# Patient Record
Sex: Male | Born: 1990 | Race: Black or African American | Hispanic: No | Marital: Single | Smoking: Never smoker
Health system: Southern US, Community
[De-identification: ages and names within clinical notes are randomized; demographics above are authoritative.]

---

## 2014-11-29 ENCOUNTER — Emergency Department (HOSPITAL_COMMUNITY): Payer: Self-pay

## 2014-11-29 ENCOUNTER — Encounter (HOSPITAL_COMMUNITY): Payer: Self-pay | Admitting: Emergency Medicine

## 2014-11-29 ENCOUNTER — Emergency Department (HOSPITAL_COMMUNITY)
Admission: EM | Admit: 2014-11-29 | Discharge: 2014-11-29 | Disposition: A | Discharge status: Home / Self Care | Payer: Self-pay | Attending: Emergency Medicine | Admitting: Emergency Medicine

## 2014-11-29 DIAGNOSIS — Y92310 Basketball court as the place of occurrence of the external cause: Secondary | ICD-10-CM | POA: Insufficient documentation

## 2014-11-29 DIAGNOSIS — Y998 Other external cause status: Secondary | ICD-10-CM | POA: Insufficient documentation

## 2014-11-29 DIAGNOSIS — S90121A Contusion of right lesser toe(s) without damage to nail, initial encounter: Secondary | ICD-10-CM

## 2014-11-29 DIAGNOSIS — W2105XA Struck by basketball, initial encounter: Secondary | ICD-10-CM | POA: Insufficient documentation

## 2014-11-29 DIAGNOSIS — S90111A Contusion of right great toe without damage to nail, initial encounter: Secondary | ICD-10-CM | POA: Insufficient documentation

## 2014-11-29 DIAGNOSIS — Y9367 Activity, basketball: Secondary | ICD-10-CM | POA: Insufficient documentation

## 2014-11-29 NOTE — Discharge Instructions (Signed)
Contusion °A contusion is a deep bruise. Contusions are the result of an injury that caused bleeding under the skin. The contusion may turn blue, purple, or yellow. Minor injuries will give you a painless contusion, but more severe contusions may stay painful and swollen for a few weeks.  °CAUSES  °A contusion is usually caused by a blow, trauma, or direct force to an area of the body. °SYMPTOMS  °· Swelling and redness of the injured area. °· Bruising of the injured area. °· Tenderness and soreness of the injured area. °· Pain. °DIAGNOSIS  °The diagnosis can be made by taking a history and physical exam. An X-ray, CT scan, or MRI may be needed to determine if there were any associated injuries, such as fractures. °TREATMENT  °Specific treatment will depend on what area of the body was injured. In general, the best treatment for a contusion is resting, icing, elevating, and applying cold compresses to the injured area. Over-the-counter medicines may also be recommended for pain control. Ask your caregiver what the best treatment is for your contusion. °HOME CARE INSTRUCTIONS  °· Put ice on the injured area. °¨ Put ice in a plastic bag. °¨ Place a towel between your skin and the bag. °¨ Leave the ice on for 15-20 minutes, 3-4 times a day, or as directed by your health care provider. °· Only take over-the-counter or prescription medicines for pain, discomfort, or fever as directed by your caregiver. Your caregiver may recommend avoiding anti-inflammatory medicines (aspirin, ibuprofen, and naproxen) for 48 hours because these medicines may increase bruising. °· Rest the injured area. °· If possible, elevate the injured area to reduce swelling. °SEEK IMMEDIATE MEDICAL CARE IF:  °· You have increased bruising or swelling. °· You have pain that is getting worse. °· Your swelling or pain is not relieved with medicines. °MAKE SURE YOU:  °· Understand these instructions. °· Will watch your condition. °· Will get help right  away if you are not doing well or get worse. °Document Released: 12/31/2004 Document Revised: 03/28/2013 Document Reviewed: 01/26/2011 °[employer]® Patient Information ©2015 [employer]. This information is not intended to replace advice given to you by your health care provider. Make sure you discuss any questions you have with your health care provider. ° °

## 2014-11-29 NOTE — ED Provider Notes (Signed)
CSN: [id number]     Arrival date & time 11/29/14  1637 History  This chart was scribed for non-physician practitioner, Langston Masker, PA-C working with Raeford Razor, MD, by Jarvis Morgan, ED Scribe. This patient was seen in room TR07C/TR07C and the patient's care was started at 4:57 PM.     Chief Complaint  Patient presents with  . Toe Injury    The history is provided by the patient. No language interpreter was used.    HPI Comments: Gregg Anderson is a 24 y.o. male who presents to the Emergency Department complaining of constant, right great toe pain onset 1 week. He states he injured the area while playing basketball and jumping up for a layup and came down and hit his toe on a basketball pole. He reports associated swelling to the area. Pt states that ambulation reproduces the pain but he is still ambulatory and has still been playing sports. He has not taken anything for the pain. He states nothing seems to make it better. He denies nay numbness, weakness, or sensation loss.   History reviewed. No pertinent past medical history. History reviewed. No pertinent past surgical history. History reviewed. No pertinent family history. Social History  Substance Use Topics  . Smoking status: Never Smoker   . Smokeless tobacco: Never Used  . Alcohol Use: Yes     Comment: occ    Review of Systems  Musculoskeletal: Positive for myalgias, joint swelling and arthralgias.  Neurological: Negative for weakness and numbness.  All other systems reviewed and are negative.     Allergies  Review of patient's allergies indicates not on file.  Home Medications   Prior to Admission medications   Not on File   Triage Vitals: BP 125/72 mmHg  Pulse 66  Temp(Src) 98.1 F (36.7 C) (Oral)  Resp 16  SpO2 100%  Physical Exam  Constitutional: He is oriented to person, place, and time. He appears well-developed and well-nourished. No distress.  HENT:  Head: Normocephalic and atraumatic.  Eyes:  Conjunctivae and EOM are normal.  Neck: Neck supple. No tracheal deviation present.  Cardiovascular: Normal rate.   Pulmonary/Chest: Effort normal. No respiratory distress.  Musculoskeletal: Normal range of motion. He exhibits tenderness.  Right great toe swollen and tender to distal phalanx  Neurological: He is alert and oriented to person, place, and time.  Skin: Skin is warm and dry.  Psychiatric: He has a normal mood and affect. His behavior is normal.  Nursing note and vitals reviewed.   ED Course  Procedures (including critical care time)  DIAGNOSTIC STUDIES: Oxygen Saturation is 100% on RA, normal by my interpretation.    COORDINATION OF CARE: 5:09 PM- Will order x-ray of right foot. Pt advised of plan for treatment and pt agrees.    Labs Review Labs Reviewed - No data to display  Imaging Review Dg Toe Great Right  11/29/2014   CLINICAL DATA:  Stubbed toe 1 week ago at basketball practice, RIGHT great toe pain dorsally, initial encounter  EXAM: RIGHT GREAT TOE  COMPARISON:  None  FINDINGS: Osseous mineralization normal.  Joint spaces preserved.  No fracture, dislocation, or bone destruction.  IMPRESSION: Normal exam.   Electronically Signed   By: Ulyses Southward M.D.   On: 11/29/2014 17:42   I have personally reviewed and evaluated these images and lab results as part of my medical decision-making.   EKG Interpretation None      MDM   Final diagnoses:  Contusion, toe, right, initial encounter  [name], PA-C 11/29/14 1814  Lonia Skinner Hubbell, PA-C 11/29/14 1817  Raeford Razor, MD 12/06/14 [phone]

## 2014-11-29 NOTE — ED Notes (Signed)
Pt states he injured his R great toe while jumping for a layup and came down, hitting toe on basketball pole.  Pt reports this happened last week and he was waiting for it to get better, but it's causing pain to walk on now.

## 2016-07-23 IMAGING — DX DG TOE GREAT 2+V*R*
3 series · 3 of 3 positions shown · non-contrast
Comparison: None

CLINICAL DATA: Stubbed toe 1 week ago at basketball practice, RIGHT
great toe pain dorsally, initial encounter

EXAM:
RIGHT GREAT TOE

[toe ap]
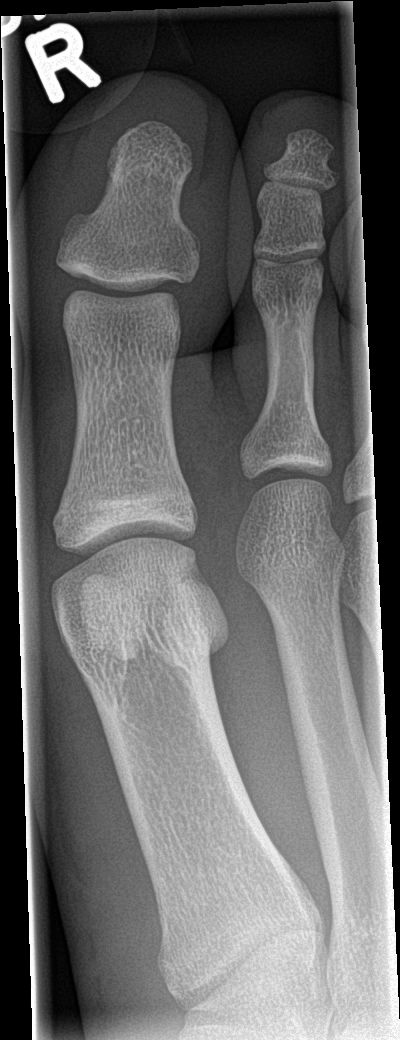

[toe obl]
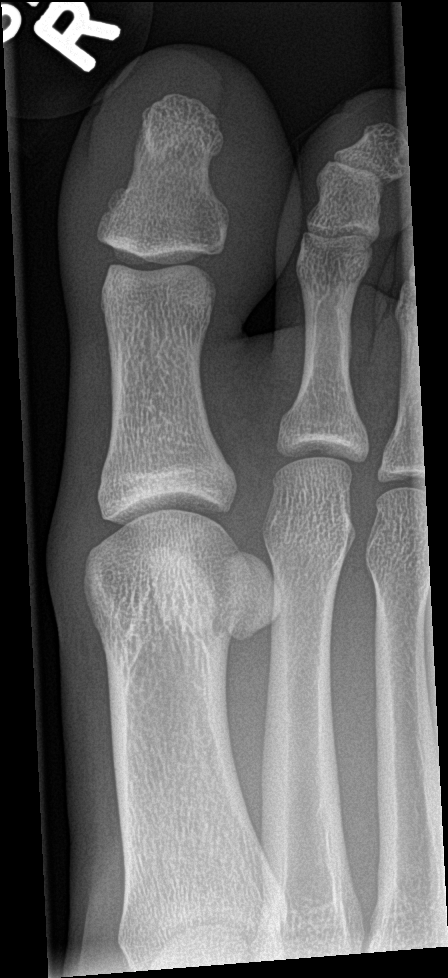

[toe lat]
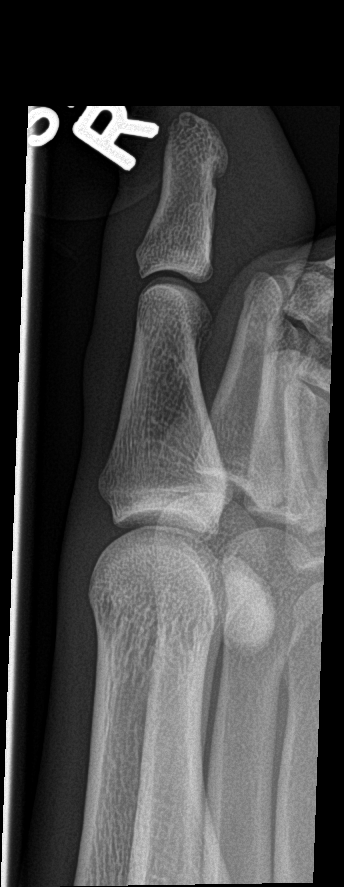

[3 of 3 positions shown; findings below may reference images not displayed]

FINDINGS: Osseous mineralization normal.

Joint spaces preserved.

No fracture, dislocation, or bone destruction.
IMPRESSION: Normal exam.
